# Patient Record
Sex: Female | Born: 1935 | Race: White | Hispanic: No
Health system: Southern US, Community
[De-identification: ages and names within clinical notes are randomized; demographics above are authoritative.]

---

## 2004-05-15 ENCOUNTER — Other Ambulatory Visit: Payer: Self-pay

## 2004-06-06 ENCOUNTER — Inpatient Hospital Stay (HOSPITAL_COMMUNITY): Admission: EM | Admit: 2004-06-06 | Discharge: 2004-06-08 | Payer: Self-pay | Admitting: Emergency Medicine

## 2004-06-23 ENCOUNTER — Other Ambulatory Visit: Payer: Self-pay

## 2005-05-22 ENCOUNTER — Emergency Department: Payer: Self-pay | Admitting: Internal Medicine

## 2005-05-22 ENCOUNTER — Other Ambulatory Visit: Payer: Self-pay

## 2005-08-12 ENCOUNTER — Ambulatory Visit: Payer: Self-pay | Admitting: Internal Medicine

## 2012-04-07 ENCOUNTER — Inpatient Hospital Stay: Payer: Self-pay | Admitting: Internal Medicine

## 2012-04-07 LAB — COMPREHENSIVE METABOLIC PANEL
Albumin: 3.2 g/dL — ABNORMAL LOW (ref 3.4–5.0)
Alkaline Phosphatase: 74 U/L (ref 50–136)
Chloride: 105 mmol/L (ref 98–107)
Creatinine: 0.89 mg/dL (ref 0.60–1.30)
EGFR (African American): >60
EGFR (Non-African Amer.): 57 — ABNORMAL LOW
Glucose: 96 mg/dL (ref 65–99)
SGOT(AST): 29 U/L (ref 15–37)
SGPT (ALT): 15 U/L

## 2012-04-07 LAB — CBC
HCT: 42.2 % (ref 35.0–47.0)
MCHC: 33.3 g/dL (ref 32.0–36.0)
RBC: 4.54 10*6/uL (ref 3.80–5.20)
RDW: 13.6 % (ref 11.5–14.5)

## 2012-04-07 LAB — TROPONIN I: Troponin-I: 0.03 ng/mL

## 2012-04-08 LAB — URINALYSIS, COMPLETE
Bacteria: NONE SEEN
Ketone: NEGATIVE
Nitrite: NEGATIVE
Ph: 5 (ref 4.5–8.0)

## 2013-03-20 ENCOUNTER — Emergency Department: Payer: Self-pay | Admitting: Unknown Physician Specialty

## 2013-03-20 LAB — URINALYSIS, COMPLETE
Bilirubin,UR: NEGATIVE
Ph: 6 (ref 4.5–8.0)
Protein: NEGATIVE
RBC,UR: 1 /HPF (ref 0–5)
Specific Gravity: 1.013 (ref 1.003–1.030)

## 2013-03-20 LAB — COMPREHENSIVE METABOLIC PANEL
Albumin: 2.7 g/dL — ABNORMAL LOW (ref 3.4–5.0)
Anion Gap: 8 (ref 7–16)
Bilirubin,Total: 0.4 mg/dL (ref 0.2–1.0)
Chloride: 108 mmol/L — ABNORMAL HIGH (ref 98–107)
Co2: 24 mmol/L (ref 21–32)
Creatinine: 0.94 mg/dL (ref 0.60–1.30)
Potassium: 3.9 mmol/L (ref 3.5–5.1)
SGOT(AST): 25 U/L (ref 15–37)
SGPT (ALT): 16 U/L (ref 12–78)
Sodium: 140 mmol/L (ref 136–145)

## 2013-03-20 LAB — CBC
HCT: 38.5 % (ref 35.0–47.0)
HGB: 12.9 g/dL (ref 12.0–16.0)
MCH: 30.6 pg (ref 26.0–34.0)
MCHC: 33.4 g/dL (ref 32.0–36.0)
Platelet: 167 10*3/uL (ref 150–440)
RBC: 4.21 10*6/uL (ref 3.80–5.20)
RDW: 15.3 % — ABNORMAL HIGH (ref 11.5–14.5)

## 2013-04-15 ENCOUNTER — Emergency Department: Payer: Self-pay | Admitting: Internal Medicine

## 2013-04-15 LAB — CBC
HCT: 42 % (ref 35.0–47.0)
MCH: 30.6 pg (ref 26.0–34.0)
MCV: 92 fL (ref 80–100)
RBC: 4.56 10*6/uL (ref 3.80–5.20)
RDW: 15 % — ABNORMAL HIGH (ref 11.5–14.5)
WBC: 16 10*3/uL — ABNORMAL HIGH (ref 3.6–11.0)

## 2013-04-15 LAB — URINALYSIS, COMPLETE
Bacteria: NONE SEEN
Glucose,UR: NEGATIVE mg/dL (ref 0–75)
Ketone: NEGATIVE
Nitrite: NEGATIVE
Protein: NEGATIVE
RBC,UR: 2 /HPF (ref 0–5)
Specific Gravity: 1.015 (ref 1.003–1.030)
Squamous Epithelial: <1
WBC UR: 2 /HPF (ref 0–5)

## 2013-04-15 LAB — COMPREHENSIVE METABOLIC PANEL
Albumin: 3 g/dL — ABNORMAL LOW (ref 3.4–5.0)
BUN: 13 mg/dL (ref 7–18)
Bilirubin,Total: 1.4 mg/dL — ABNORMAL HIGH (ref 0.2–1.0)
Calcium, Total: 8.7 mg/dL (ref 8.5–10.1)
Co2: 24 mmol/L (ref 21–32)
EGFR (African American): >60
Osmolality: 279 (ref 275–301)
Potassium: 4.3 mmol/L (ref 3.5–5.1)
SGOT(AST): 110 U/L — ABNORMAL HIGH (ref 15–37)
Sodium: 140 mmol/L (ref 136–145)

## 2013-04-15 LAB — TROPONIN I: Troponin-I: <0.02 ng/mL

## 2013-04-21 LAB — CULTURE, BLOOD (SINGLE)

## 2013-05-26 IMAGING — CT CT HEAD WITHOUT CONTRAST
2 series · 15 of 30 positions shown, 19 images · non-contrast
Comparison: none

REASON FOR EXAM: altered mental status
COMMENTS:   May transport without cardiac monitor

PROCEDURE:     CT  - CT HEAD WITHOUT CONTRAST  - April 07, 2012  [DATE]
RESULT:     History: Altered mental status.
Comparison Study: Prior CT of 08/12/2005.

[Series 2: without · axial · non-contrast · 0.41mm/px · z∈[-197,-67]mm · 13 of 32 slices shown, 17 images]
[im 3/32  brain]
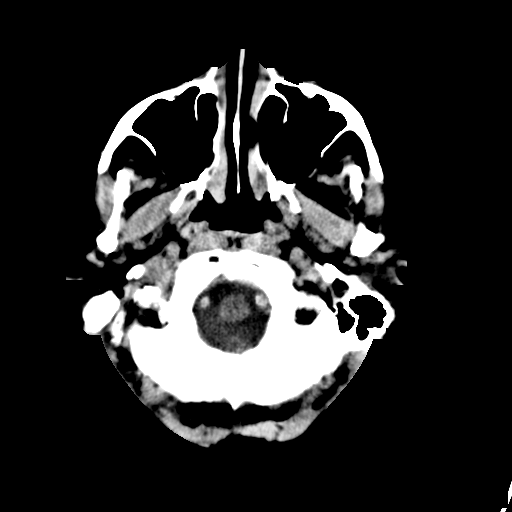
[im 3/32  bone]
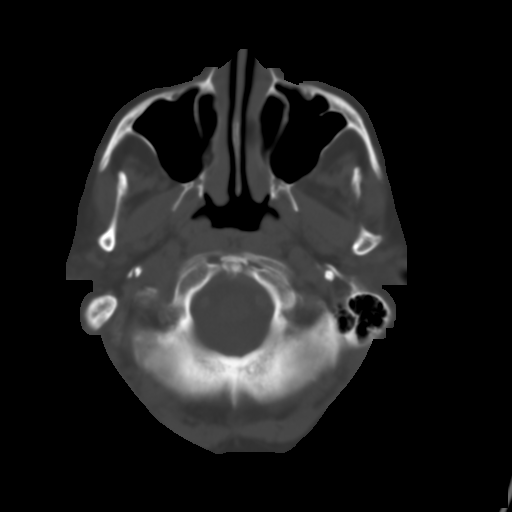
[im 5/32  brain]
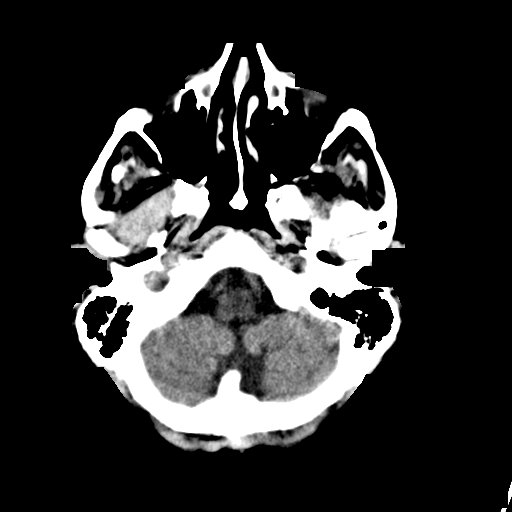
[im 7/32  brain]
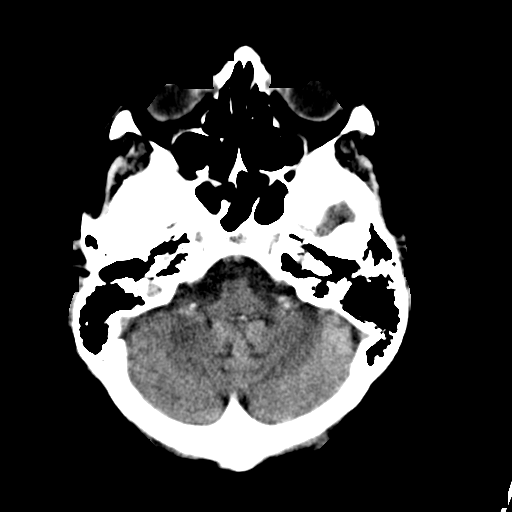
[im 9/32  brain]
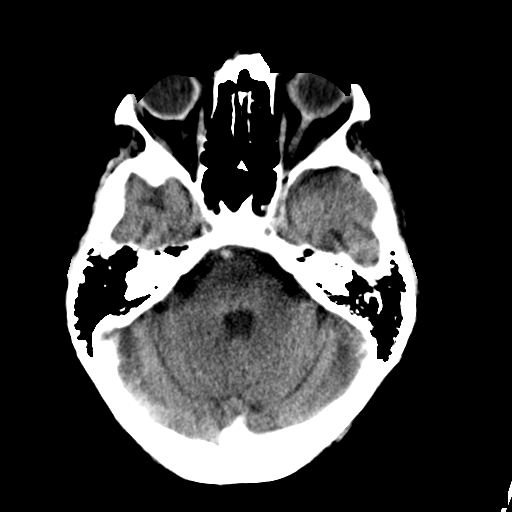
[im 12/32  brain]
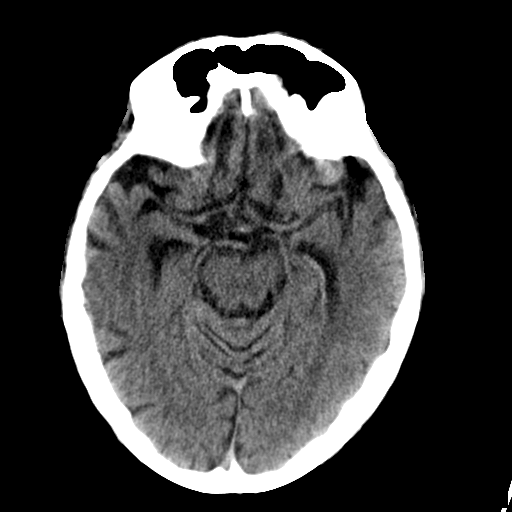
[im 12/32  bone]
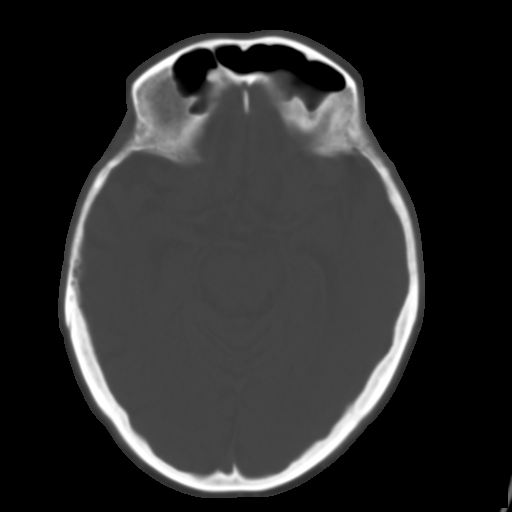
[im 14/32  brain]
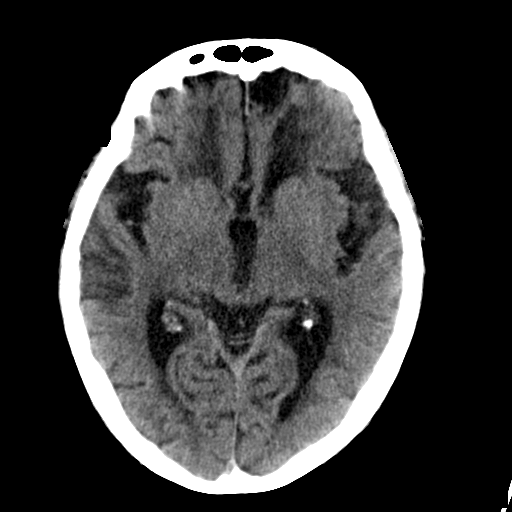
[im 16/32  brain]
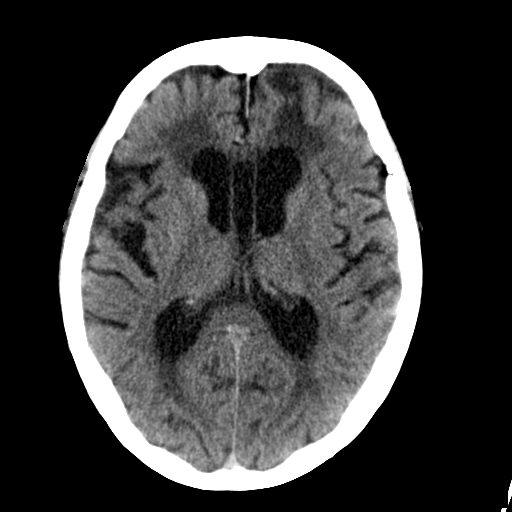
[im 18/32  brain]
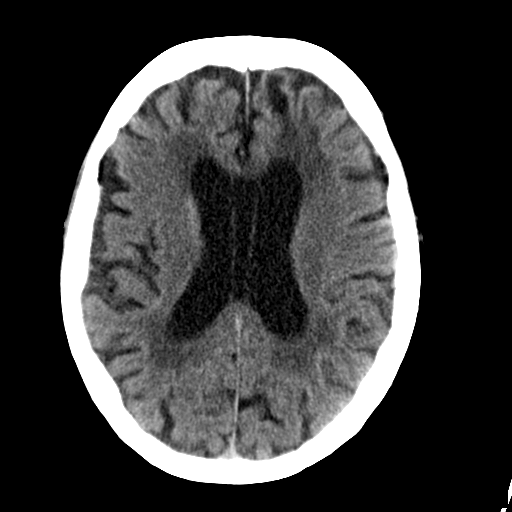
[im 20/32  brain]
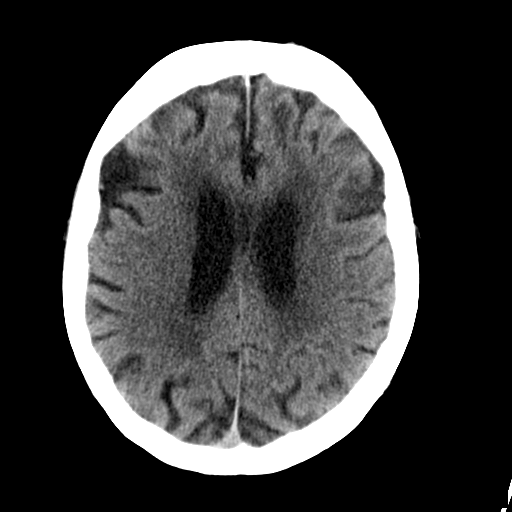
[im 20/32  bone]
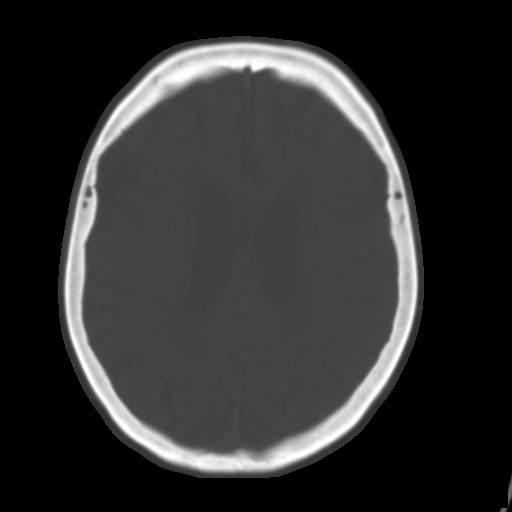
[im 23/32  brain]
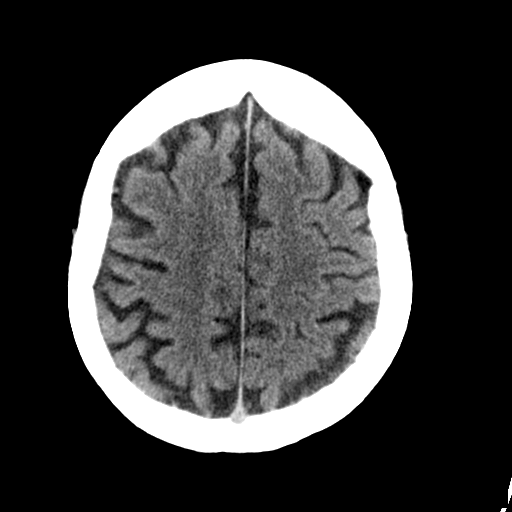
[im 25/32  brain]
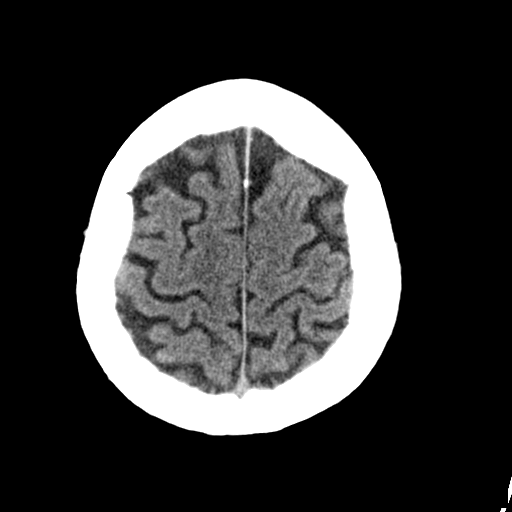
[im 27/32  brain]
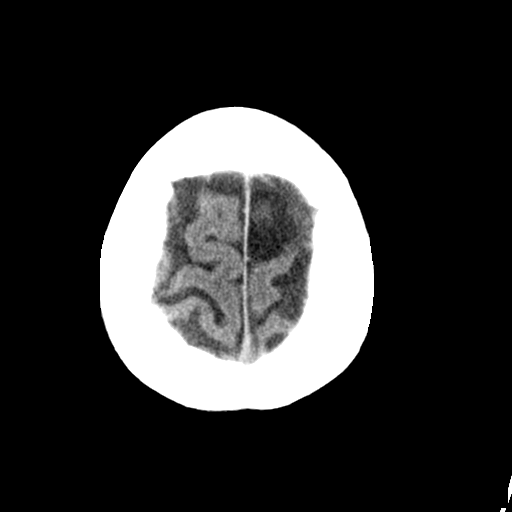
[im 29/32  brain]
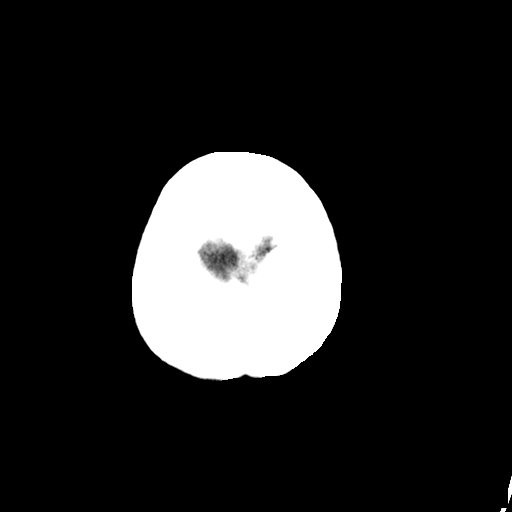
[im 29/32  bone]
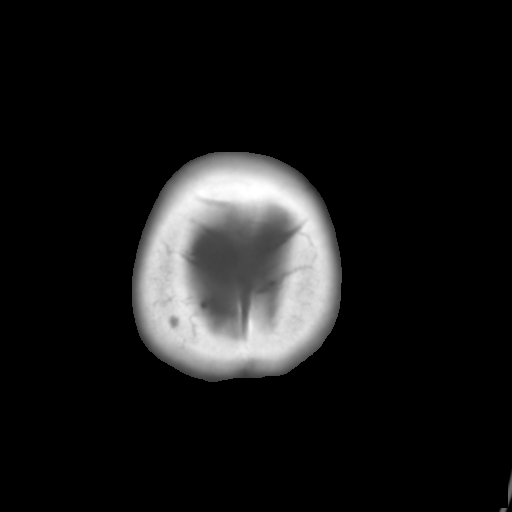

[Series 3: bone · axial · 0.41mm/px · z∈[-197,-177]mm · 2 of 32 slices shown]
[im 3/32  bone]
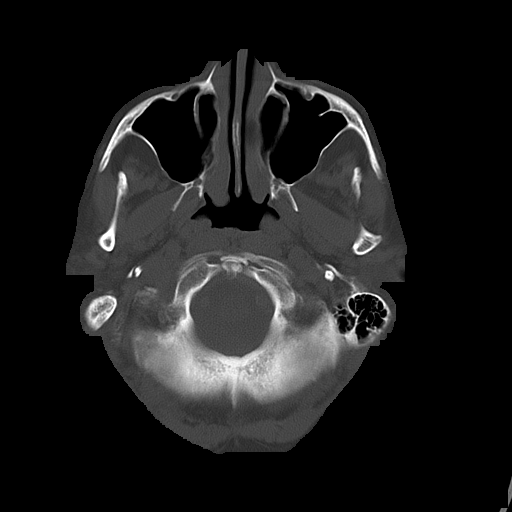
[im 7/32  bone]
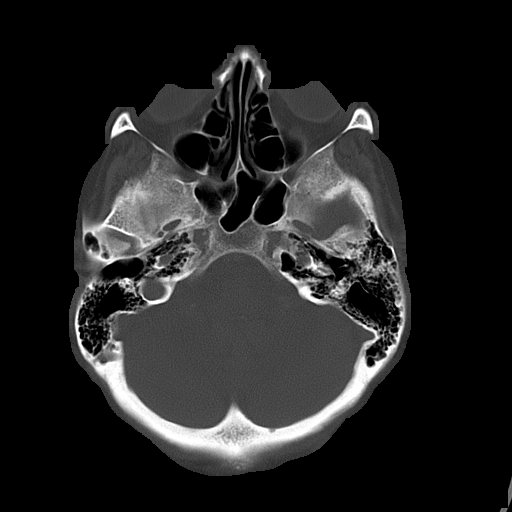

[15 of 30 positions shown; findings below may reference images not displayed]

FINDINGS: Standard nonenhanced CT obtained. Evaluation in 3 dimensions on a
separate workstation performed. Noted in the left temporal fossa is a
rounded area of high density measuring up to 1 cm. This could represent
focal hemorrhage or aneurysm of the left middle cerebral artery.
Neurosurgical consultation is suggested. No other focal acute abnormality
identified. The above described finding is new from 2774. There is diffuse
atrophy and white matter change consistent with chronic ischemia. Posterior
fossa unremarkable.
IMPRESSION: Cannot exclude focal hemorrhage or aneurysm in the left
temporal fossa. Neurosurgical consultation should be considered. This report
was phoned to the patient's physician at time of study.

## 2013-12-27 ENCOUNTER — Inpatient Hospital Stay: Payer: Self-pay | Admitting: Internal Medicine

## 2013-12-27 LAB — CBC WITH DIFFERENTIAL/PLATELET
BASOS ABS: 0.2 10*3/uL — AB (ref 0.0–0.1)
BASOS PCT: 0.9 %
Eosinophil #: 0 10*3/uL (ref 0.0–0.7)
Eosinophil %: 0.1 %
HCT: 38.2 % (ref 35.0–47.0)
HGB: 12.8 g/dL (ref 12.0–16.0)
LYMPHS PCT: 17.5 %
Lymphocyte #: 2.9 10*3/uL (ref 1.0–3.6)
MCH: 30.1 pg (ref 26.0–34.0)
MCHC: 33.5 g/dL (ref 32.0–36.0)
MCV: 90 fL (ref 80–100)
MONO ABS: 1.6 x10 3/mm — AB (ref 0.2–0.9)
MONOS PCT: 9.4 %
NEUTROS ABS: 12.1 10*3/uL — AB (ref 1.4–6.5)
NEUTROS PCT: 72.1 %
Platelet: 187 10*3/uL (ref 150–440)
RBC: 4.24 10*6/uL (ref 3.80–5.20)
RDW: 14 % (ref 11.5–14.5)
WBC: 16.8 10*3/uL — ABNORMAL HIGH (ref 3.6–11.0)

## 2013-12-27 LAB — COMPREHENSIVE METABOLIC PANEL
AST: 14 U/L — AB (ref 15–37)
Albumin: 2.8 g/dL — ABNORMAL LOW (ref 3.4–5.0)
Alkaline Phosphatase: 91 U/L
Anion Gap: 6 — ABNORMAL LOW (ref 7–16)
BUN: 15 mg/dL (ref 7–18)
Bilirubin,Total: 0.3 mg/dL (ref 0.2–1.0)
CALCIUM: 8.6 mg/dL (ref 8.5–10.1)
CHLORIDE: 105 mmol/L (ref 98–107)
Co2: 26 mmol/L (ref 21–32)
Creatinine: 0.97 mg/dL (ref 0.60–1.30)
EGFR (African American): 58 — ABNORMAL LOW
GFR CALC NON AF AMER: 50 — AB
Glucose: 133 mg/dL — ABNORMAL HIGH (ref 65–99)
Osmolality: 277 (ref 275–301)
Potassium: 4 mmol/L (ref 3.5–5.1)
SGPT (ALT): 15 U/L (ref 12–78)
Sodium: 137 mmol/L (ref 136–145)
Total Protein: 8 g/dL (ref 6.4–8.2)

## 2013-12-27 LAB — URINALYSIS, COMPLETE
BILIRUBIN, UR: NEGATIVE
BLOOD: NEGATIVE
Bacteria: NONE SEEN
GLUCOSE, UR: NEGATIVE mg/dL (ref 0–75)
Ketone: NEGATIVE
LEUKOCYTE ESTERASE: NEGATIVE
NITRITE: NEGATIVE
PH: 7 (ref 4.5–8.0)
PROTEIN: NEGATIVE
RBC,UR: 1 /HPF (ref 0–5)
SPECIFIC GRAVITY: 1.023 (ref 1.003–1.030)

## 2013-12-27 LAB — RAPID INFLUENZA A&B ANTIGENS

## 2013-12-28 LAB — BASIC METABOLIC PANEL
Anion Gap: 6 — ABNORMAL LOW (ref 7–16)
BUN: 11 mg/dL (ref 7–18)
CALCIUM: 8.4 mg/dL — AB (ref 8.5–10.1)
CREATININE: 0.85 mg/dL (ref 0.60–1.30)
Chloride: 105 mmol/L (ref 98–107)
Co2: 25 mmol/L (ref 21–32)
EGFR (Non-African Amer.): 59 — ABNORMAL LOW
Glucose: 117 mg/dL — ABNORMAL HIGH (ref 65–99)
OSMOLALITY: 272 (ref 275–301)
Potassium: 3.4 mmol/L — ABNORMAL LOW (ref 3.5–5.1)
Sodium: 136 mmol/L (ref 136–145)

## 2013-12-28 LAB — CBC WITH DIFFERENTIAL/PLATELET
BASOS ABS: 0 10*3/uL (ref 0.0–0.1)
BASOS PCT: 0.2 %
EOS ABS: 0.1 10*3/uL (ref 0.0–0.7)
EOS PCT: 0.5 %
HCT: 36.6 % (ref 35.0–47.0)
HGB: 12.2 g/dL (ref 12.0–16.0)
Lymphocyte #: 2.2 10*3/uL (ref 1.0–3.6)
Lymphocyte %: 17.8 %
MCH: 29.9 pg (ref 26.0–34.0)
MCHC: 33.2 g/dL (ref 32.0–36.0)
MCV: 90 fL (ref 80–100)
MONO ABS: 1.4 x10 3/mm — AB (ref 0.2–0.9)
Monocyte %: 11.1 %
NEUTROS ABS: 8.7 10*3/uL — AB (ref 1.4–6.5)
NEUTROS PCT: 70.4 %
Platelet: 174 10*3/uL (ref 150–440)
RBC: 4.06 10*6/uL (ref 3.80–5.20)
RDW: 13.7 % (ref 11.5–14.5)
WBC: 12.3 10*3/uL — AB (ref 3.6–11.0)

## 2013-12-29 LAB — URINE CULTURE

## 2013-12-30 LAB — CBC WITH DIFFERENTIAL/PLATELET
BASOS PCT: 0.9 %
Basophil #: 0.1 10*3/uL (ref 0.0–0.1)
EOS PCT: 1.4 %
Eosinophil #: 0.1 10*3/uL (ref 0.0–0.7)
HCT: 41.2 % (ref 35.0–47.0)
HGB: 13.9 g/dL (ref 12.0–16.0)
Lymphocyte #: 3.6 10*3/uL (ref 1.0–3.6)
Lymphocyte %: 39.5 %
MCH: 30.6 pg (ref 26.0–34.0)
MCHC: 33.7 g/dL (ref 32.0–36.0)
MCV: 91 fL (ref 80–100)
MONO ABS: 0.8 x10 3/mm (ref 0.2–0.9)
Monocyte %: 8.6 %
Neutrophil #: 4.5 10*3/uL (ref 1.4–6.5)
Neutrophil %: 49.6 %
PLATELETS: 215 10*3/uL (ref 150–440)
RBC: 4.55 10*6/uL (ref 3.80–5.20)
RDW: 13.9 % (ref 11.5–14.5)
WBC: 9.1 10*3/uL (ref 3.6–11.0)

## 2013-12-30 LAB — BASIC METABOLIC PANEL
Anion Gap: 7 (ref 7–16)
BUN: 14 mg/dL (ref 7–18)
CALCIUM: 8.8 mg/dL (ref 8.5–10.1)
CO2: 23 mmol/L (ref 21–32)
CREATININE: 0.97 mg/dL (ref 0.60–1.30)
Chloride: 106 mmol/L (ref 98–107)
EGFR (Non-African Amer.): 50 — ABNORMAL LOW
GFR CALC AF AMER: 58 — AB
GLUCOSE: 219 mg/dL — AB (ref 65–99)
OSMOLALITY: 279 (ref 275–301)
Potassium: 3.7 mmol/L (ref 3.5–5.1)
Sodium: 136 mmol/L (ref 136–145)

## 2015-02-14 IMAGING — CR DG CHEST 2V
1 series · 2 of 2 positions shown · non-contrast
Comparison: 04/07/2012

CLINICAL DATA: Fever to 103 top lethargy, cough

EXAM:
CHEST  2 VIEW

[Series 3: w chest lat · 0.14mm/px · 2 of 2 slices shown]
[im 1/2]
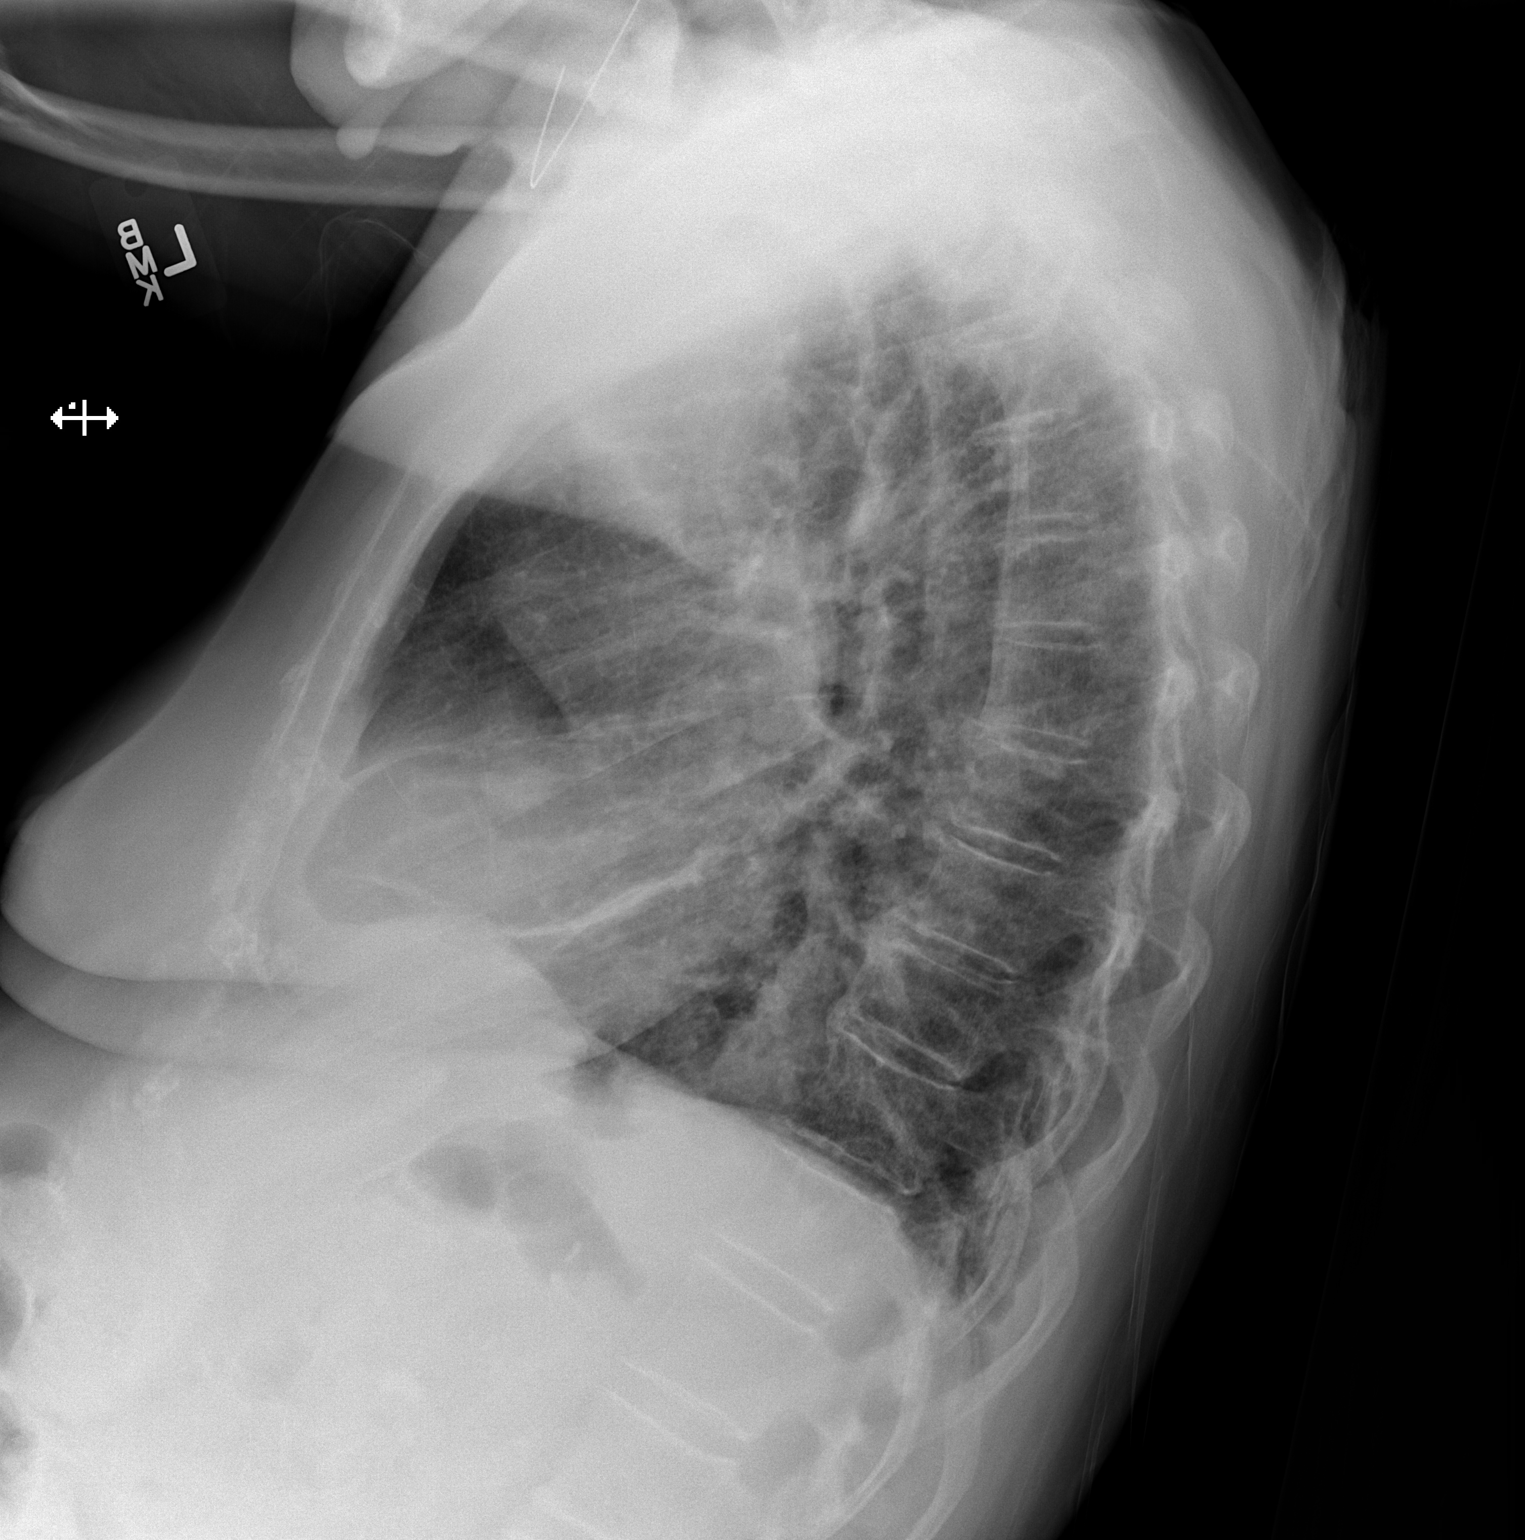
[im 2/2]
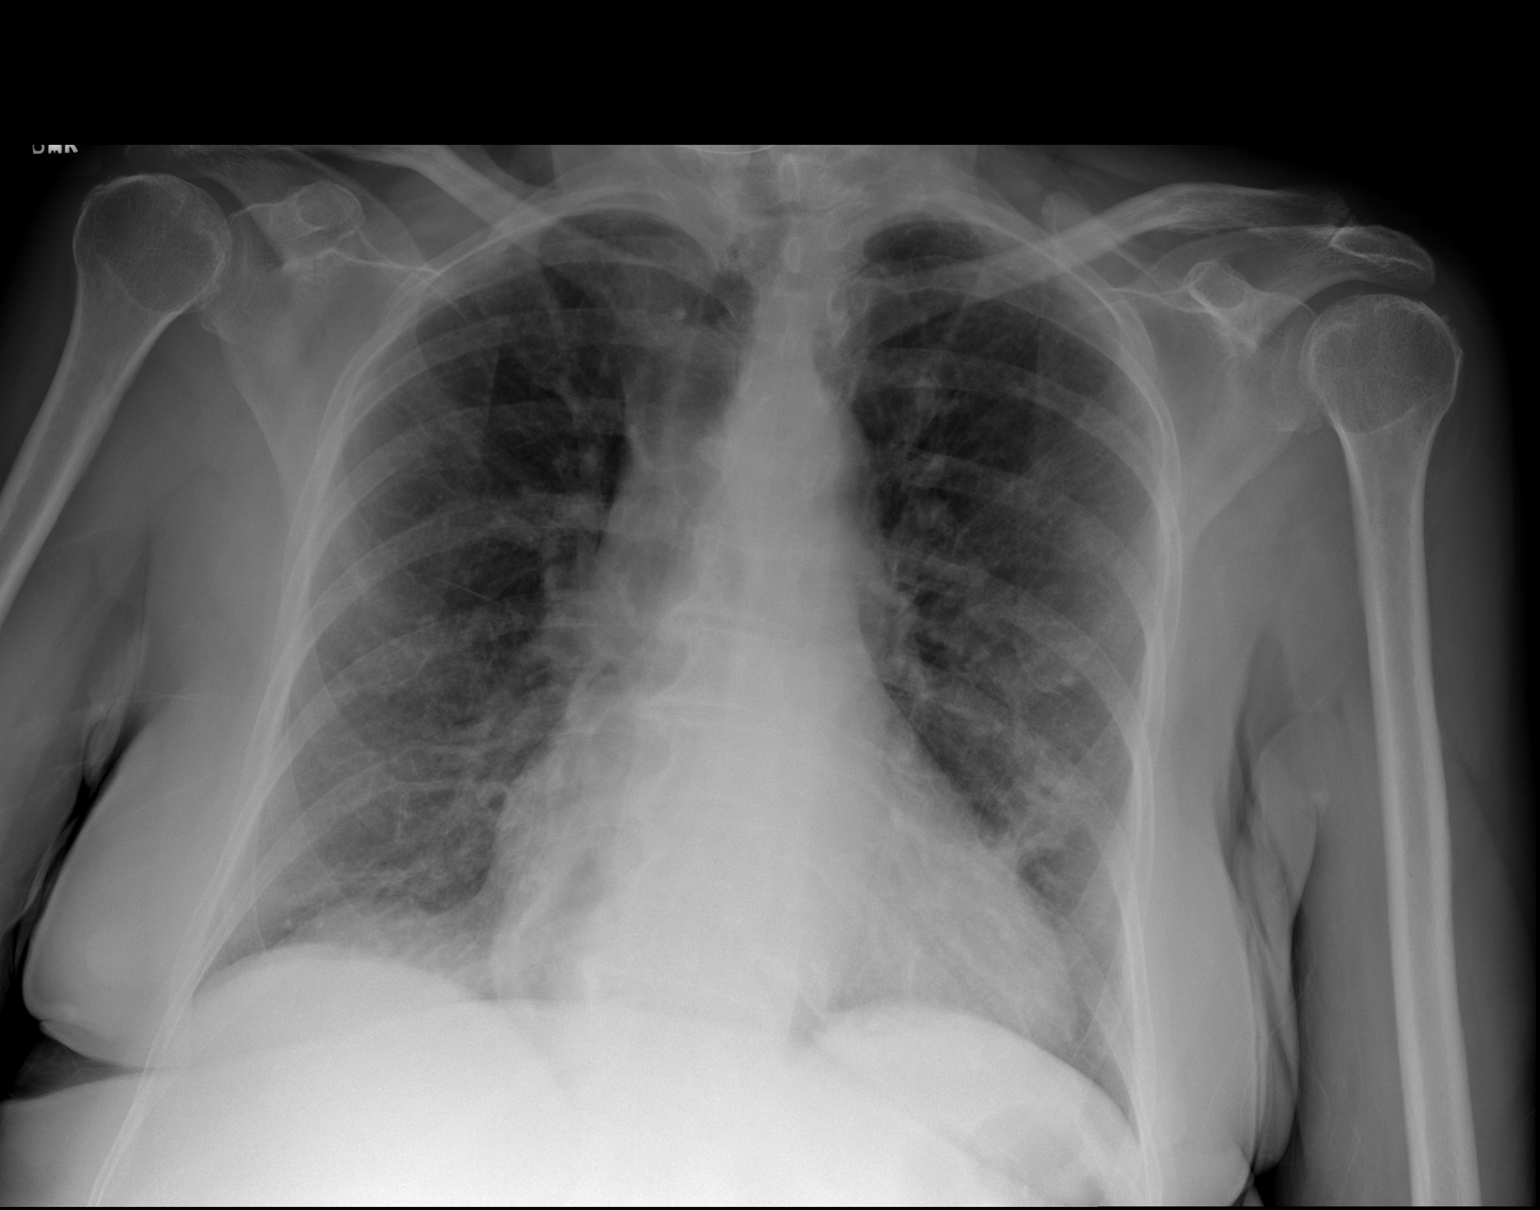

[2 of 2 positions shown; findings below may reference images not displayed]

FINDINGS: Mild cardiac enlargement stable. Vascular pattern normal. Right lung
is clear. There is patchy opacity in the lingula, which is new from
the prior study. There are no pleural effusions.
IMPRESSION: Consolidation in the lingula suspicious for pneumonia. Recommend
radiographic followup with appropriate therapy to ensure resolution.

## 2015-04-19 NOTE — Discharge Summary (Signed)
PATIENT NAME:  Leah Calderon, Leah Calderon MR#:  [id number] DATE OF BIRTH:  03-Jul-1918  DATE OF ADMISSION:  12/27/2013 DATE OF DISCHARGE:  12/31/2013  ADMITTING DIAGNOSIS: Fever and decrease in responsiveness.   DISCHARGE DIAGNOSES:  1.  Clinical sepsis due to pneumonia with tachycardia, fever and leukocytosis.  2.  Acute metabolic encephalopathy due to infection, now back to baseline.  3.  Pneumonia, now symptoms improved.  4.  Dementia.  5.  History of seizure.  6.  History of cerebrovascular accident.   CONSULTANTS: None.   PERTINENT LABS AND EVALUATIONS: Influenza A and B were negative. Urine cultures no growth. WBC count 16.8, hemoglobin 12.8, platelet count was 187. LFTs: Total protein 8.0, albumin 2.8, bilirubin total 0.3, alkaline phosphatase 91, AST 14, ALT 15. BMP: Glucose 133, BUN 15, creatinine 0.97, sodium 137, potassium 4.0, chloride 105, CO2 is 26, calcium was 8.6. Lactic acid 2.2. Chest x-ray showed consolidation in the lingula suspicious for pneumonia.   HOSPITAL COURSE: Please refer to H and P done by the admitting physician. The patient is a pleasant 81 year old white female with history of dementia, previous CVA and seizure who was sent from an assisted living for fever and decrease in responsiveness. The patient was evaluated in the ED and was noted to have a chest x-ray that suggested pneumonia. The patient was noticed to have tachycardia and fever consistent with possible sepsis syndrome. The patient was admitted and placed on IV antibiotics. Unfortunately, she kept pulling her IV out therefore, she had to switch over to IM Rocephin. However, with these treatments and some IV hydration, her mental status resolved. She stopped being febrile. Her WBC count normalized. At this time, she is doing much better and is back to baseline.   DISCHARGE MEDICATIONS: At the time of discharge, metoprolol tartrate 25 mg 1 tab in the morning and 1/2 tab in the evening, guiafenesin 100 mg per 5 mL,  10 mL q.6 as needed for cough, lorazepam 0.5 mg q.6 hours p.Calderon.n. for agitation, milk of magnesia 30 mL as needed for constipation, aspirin 81 mg 1 tab p.o. daily, gabapentin 200 mg t.i.d., Keppra 500 mg 1 tab p.o. b.i.d., quetiapine 25 mg at bedtime, Imodium AD 1 tab p.Calderon.n. for loose stool, Mylanta 30 mL q.6 hours p.Calderon.n. for heartburn, omeprazole 40 mg daily, Tylenol 650 q.4 hours p.Calderon.n. for pain or temperature greater than 100.4, azithromycin 250 mg 1 tab p.o. daily x 3 more days and Ceftin 500 mg 1 tab p.o. b.i.d. x 3 more days.   DIET: Low-sodium, low-fat, low-cholesterol.   ACTIVITY: As tolerated.   FOLLOWUP: With primary M.D. in 1 to 2 weeks. The patient encouraged to drink plenty of fluids.  TIME SPNE: 35 minutes.  ____________________________ Lacie ScottsShreyang H. Allena KatzPatel, MD shp:aw D: 01/01/2014 08:23:10 ET T: 01/01/2014 08:36:27 ET JOB#: [id number]  cc: Jeroline Wolbert H. Allena KatzPatel, MD, <Dictator> Charise CarwinSHREYANG H Raeshawn Vo MD ELECTRONICALLY SIGNED 01/04/2014 12:43

## 2015-04-19 NOTE — H&P (Signed)
PATIENT NAME:  Leah Calderon, ORWICK MR#:  [id number] DATE OF BIRTH:  1918-01-09  DATE OF ADMISSION:  12/27/2013  PRIMARY CARE PHYSICIAN: Dr. Cathlean Marseilles.   CHIEF COMPLAINT: Sent in for fever, decreased responsiveness.   HISTORY OF PRESENT ILLNESS: This is a 81 year old female who lives over at assisted living, [employer] unit. She is sent in with fever and decreased mental status. Found to have an area on chest x-ray suspicious for pneumonia. Found to have a fever and elevated white count. Hospitalist services were contacted for further evaluation. The patient is unable to give much history.   PAST MEDICAL HISTORY: Dementia, previous CVA, seizure.   PAST SURGICAL HISTORY: None.   ALLERGIES: No known drug allergies.   MEDICATIONS: As per Prescription Writer include aspirin 81 mg daily, cranberry 1 capsule daily, gabapentin 100 mg 2 capsules 3 times a day, guaifenesin 2 teaspoons every 6 hours as needed for cough, Imodium A-D p.r.n., Keppra 500 mg twice a day, lorazepam 0.5 mL topically to affected area as needed for agitation, Mapap 500 mg every 4 hours as needed for pain, metoprolol 25 mg 1 tablet in the morning and 1/2 tablet in the evening, milk of magnesia 8% 30 mL at bedtime as needed for constipation, Mylanta extra strength 30 mL every 6 hours as needed for indigestion, omeprazole 40 mg daily, Seroquel 25 mg at bedtime.   SOCIAL HISTORY: At [facility] assisted living, memory care.   FAMILY HISTORY: Mother died of CHF, old age. Father died of uremic poisoning.   REVIEW OF SYSTEMS: Difficult to obtain secondary to dementia. The patient answers no to all questions.   PHYSICAL EXAMINATION:  VITAL SIGNS: On presentation included a temperature of 100.1, pulse 110, respirations 24, blood pressure 149/63, pulse ox 92% on room air.  GENERAL: No respiratory distress.  EYES: Conjunctivae and lids normal. Pupils round, reactive to light, equal. Unable to test extraocular muscles.   EARS, NOSE, MOUTH AND THROAT: Nasal mucosa: No erythema. Throat: No erythema. No exudate seen. Lips and gums: No lesions.  NECK: No JVD. No bruits. No lymphadenopathy. No thyromegaly. No thyroid nodules palpated.  RESPIRATORY: Lungs poor air entry. Rhonchi bilateral bases. No wheeze. No rales heard.  CARDIOVASCULAR SYSTEM: S1 and S2, tachycardic, a 2/6 systolic ejection murmur. Carotid upstroke 2+ bilaterally. No bruits. Dorsalis pedis pulses 1+ bilaterally. Edema 2+ bilateral lower extremities.  ABDOMEN: Soft, nontender. No organo- or splenomegaly. Normoactive bowel sounds. No masses felt.  LYMPHATIC: No lymph nodes in the neck.  MUSCULOSKELETAL: Edema 2+. No clubbing. No cyanosis.  SKIN: Chronic lower extremity pinkish discoloration.  NEUROLOGIC: The patient is alert. Answers yes or no questions. Does not elaborate much. The patient moves all extremities on her own and to command.   LABORATORY AND RADIOLOGICAL DATA: Chest x-ray: Pneumonia in the lingula. Lactic acid 2.2. Glucose 133, BUN 15, creatinine 0.97, sodium 137, potassium 4.0, chloride 105, CO2 26, calcium 8.6. Liver function tests normal range. Albumin low at 2.8. White blood cell count 16.8, H and H 12.8 and 38.2, platelet count of 187. Urinalysis negative. Influenza negative. No EKG done.   ASSESSMENT AND PLAN:  1. Clinical sepsis with pneumonia, tachycardia, fever and leukocytosis in a 81 year old patient from an assisted living facility. Will get aggressive with antibiotics, Levaquin and Rocephin, at this point and continue to monitor closely. Blood cultures were not drawn prior to antibiotics, so I will not draw them. The patient will not be able to give a sputum culture. Will have  to treat empirically at this point.  2. Dementia: The patient is on Seroquel for sleep.  3. Seizure history: Has not had a seizure in years, is on Keppra b.i.d.  4. History of cerebrovascular accident, on aspirin.  5. Acute encephalopathy, likely  secondary to sepsis and pneumonia: Will get physical therapy evaluation and social worker evaluation. Hopefully, the patient can go back to her facility.   TIME SPENT ON ADMISSION: 50 minutes.   The patient is a DNR.   ____________________________ Herschell Dimesichard J. Renae GlossWieting, MD rjw:gb D: 12/27/2013 21:43:29 ET T: 12/27/2013 22:07:53 ET JOB#: [id number]  cc: Herschell Dimesichard J. Renae GlossWieting, MD, <Dictator> Cathlean MarseillesParul Bhagalia, M.D. Salley ScarletICHARD J Keil Pickering MD ELECTRONICALLY SIGNED 12/28/2013 16:50

## 2015-04-20 NOTE — H&P (Signed)
PATIENT NAME:  Leah Calderon, Leah Calderon MR#:  [id number] DATE OF BIRTH:  1918-07-19  DATE OF ADMISSION:  04/07/2012  REFERRING PHYSICIAN: Ferman Hamming, MD   FAMILY PHYSICIAN: Fulton Reek, MD  REASON FOR ADMISSION: Altered mental status.   HISTORY OF PRESENT ILLNESS: The patient is a 81 year old female with a history of previous stroke and seizures presents to the Emergency Room with acute onset of worsening mental status changes associated with expressive aphasia. In the Emergency Room, the patient was in no acute distress, but CT of the head revealed questionable aneurysm versus bleed in the left temporal fossa. She is now admitted for further evaluation. The family is unable to care for the patient. They do want her kept as COMFORT CARE and placed in a nursing home.   PAST MEDICAL HISTORY:  1. Previous stroke.  2. Subsequent seizures.  3. Progressive dementia.  4. History of multiple miscarriages.   MEDICATIONS: Keppra 500 mg p.o. b.i.d.   ALLERGIES: No known drug allergies.   SOCIAL HISTORY: Negative for alcohol or tobacco abuse.   FAMILY HISTORY: Noncontributory.   REVIEW OF SYSTEMS: Unable to obtain from the patient.   PHYSICAL EXAMINATION:  GENERAL: The patient is in no acute distress.   VITAL SIGNS: Vital signs are currently remarkable for a blood pressure of 151/72, with a heart rate of 71, and a respiratory rate of 22. She is afebrile.   HEENT: Normocephalic, atraumatic. Pupils are equally round and reactive to light and accommodation. Extraocular movements are intact. Sclerae are anicteric. Conjunctivae are clear. Oropharynx is clear.    NECK: Supple without jugular venous distention or bruits. No adenopathy or thyromegaly was noted.   LUNGS: Clear to auscultation and percussion without wheezes, rales, or rhonchi. No dullness.   CARDIAC: Regular rate and rhythm with normal S1, S2. No significant rubs, murmurs, or gallops. PMI is nondisplaced. Chest wall is nontender.    ABDOMEN: Soft, nontender, with normoactive bowel sounds. No organomegaly or masses were appreciated. No hernias or bruits were noted.   EXTREMITIES: Without clubbing or cyanosis. Trace edema was noted. Pulses are 1+ bilaterally.   SKIN: Warm and dry without rash or lesions.   NEUROLOGICAL: Cranial nerves II through XII are grossly intact. Deep tendon reflexes were symmetric. Motor and sensory exam is nonfocal.   PSYCHIATRIC: Exam revealed a patient who is alert but disoriented to person, place, and time.   LABORATORY, DIAGNOSTIC AND RADIOLOGICAL DATA:  CBC and a MET-B were within normal limits. GFR was 57.  CT scan of the brain revealed a focal hemorrhage versus aneurysm in the left temporal fossa.   ASSESSMENT:  1. Altered mental status.  2. Seizure disorder.  3. Presumed hemorrhagic stroke in the left temporal fossa.  4. Mild renal insufficiency.  5. Progressive dementia.  6. Borderline hypertension.  PLAN: The patient will be admitted to the floor as a NO CODE BLUE, DO NOT RESUSCITATE. We will begin a soft diet. We will continue her Keppra but will load with Cerebyx empirically. We will obtain an MRI of the brain and consult Neurology. We will consult Physical Therapy. We will consult the Care Management team for placement. Further treatment and evaluation will depend upon the patient's progress.   TOTAL TIME SPENT: 50 minutes.   ____________________________ Leonie Douglas Doy Hutching, MD jds:cbb D: 04/07/2012 22:57:45 ET T: 04/08/2012 08:15:40 ET JOB#: [id number]  cc: Leonie Douglas. Doy Hutching, MD, <Dictator> Aimee Timmons Lennice Sites MD ELECTRONICALLY SIGNED 04/08/2012 22:19

## 2015-04-20 NOTE — Discharge Summary (Signed)
PATIENT NAME:  Leah Calderon, [name] MR#:  [id number] DATE OF BIRTH:  1918-08-15  DATE OF ADMISSION:  04/07/2012 DATE OF DISCHARGE:    TYPE OF DISCHARGE: The patient is transferred to a skilled nursing facility.   REASON FOR ADMISSION: Altered mental status.   HISTORY OF PRESENT ILLNESS: The patient is a 81 year old female with a history of previous stroke and seizures who presented to the Emergency Room with worsening mental status changes and expressive aphasia. Initial CT in the Emergency Room showed a questionable aneurysm versus bleed in the left temporal fossa. She was admitted as a NO CODE/COMFORT CARE for further care and placement.   PAST MEDICAL HISTORY:  1. Previous stroke.  2. Seizure disorder.  3. Progressive dementia.   MEDICATIONS ON ADMISSION: Keppra 500 mg p.o. b.i.d.   ALLERGIES: No known drug allergies.   SOCIAL HISTORY: Negative for alcohol or tobacco abuse.   FAMILY HISTORY: Noncontributory.   REVIEW OF SYSTEMS: Unable to obtain.   PHYSICAL EXAMINATION: GENERAL: The patient was in no acute distress. VITAL SIGNS: Vital signs were stable, and she was afebrile. HEENT: Exam was unremarkable. NECK: Supple without jugular venous distention. LUNGS: Clear. CARDIAC: Exam revealed a regular rate and rhythm with normal S1, S2. ABDOMEN: Soft and nontender. EXTREMITIES: Trace edema. Pulses are 1+ bilaterally. NEUROLOGICAL: Exam was grossly nonfocal.   HOSPITAL COURSE: The patient was admitted with an abnormal head CT with worsening mental status changes and a history of seizures. The patient was admitted as a NO CODE BLUE, DO NOT RESUSCITATE. MRI of the brain revealed no acute abnormality suggestive of bleed or aneurysm. Her CT was actually stable. She was maintained on Keppra. She was seen in consultation by Physical Therapy and Speech Therapy. A pureed diet was recommended, and she did well with this. The family wanted her placed for terminal care. [facility] She is now transferred there for further care and treatment.   DISCHARGE DIAGNOSES:  1. Progressive vascular dementia.  2. Previous stroke.  3. Seizure disorder.  4. Benign hypertension.  5. Renal insufficiency.   DISCHARGE MEDICATIONS:  1. Keppra 500 mg p.o. b.i.d.  2. Enteric-coated aspirin 81 mg p.o. daily.  3. Ativan 1 mg p.o. every 4 hours p.r.n. agitation.  4. Lopressor 25 mg p.o. b.i.d.  5. Protonix 40 mg p.o. daily.  6. Ambien 5 mg p.o. at bedtime.   FOLLOW-UP PLANS AND APPOINTMENTS:  1. The [facility] will be [facility] as a NO CODE BLUE, DO NOT RESUSCITATE. She will be followed by the resident physician there.  2. She is on a pureed diet. We will have Gatorade supplements 12 ounces at lunch and dinner.  3. She will be seen in consultation by Physical Therapy and Speech Therapy.   ____________________________ Duane LopeJeffrey D. Judithann SheenSparks, MD jds:cbb D: 04/11/2012 11:16:09 ET T: 04/11/2012 11:23:56 ET JOB#: [id number]  cc: Duane LopeJeffrey D. Judithann SheenSparks, MD, <Dictator> JEFFREY Rodena Medin SPARKS MD ELECTRONICALLY SIGNED 04/11/2012 15:06

## 2016-03-27 DEATH — deceased

## 2023-06-16 ENCOUNTER — Other Ambulatory Visit (HOSPITAL_COMMUNITY): Payer: Self-pay
